# Patient Record
Sex: Female | Born: 1990 | Race: Black or African American | Hispanic: No | Marital: Married | Smoking: Never smoker
Health system: Southern US, Community
[De-identification: ages and names within clinical notes are randomized; demographics above are authoritative.]

## PROBLEM LIST (undated history)

## (undated) DIAGNOSIS — N289 Disorder of kidney and ureter, unspecified: Secondary | ICD-10-CM

## (undated) HISTORY — PX: TONSILLECTOMY: SUR1361

## (undated) HISTORY — PX: OTHER SURGICAL HISTORY: SHX169

## (undated) HISTORY — PX: ADENOIDECTOMY: SUR15

---

## 2020-09-30 ENCOUNTER — Emergency Department (HOSPITAL_COMMUNITY): Payer: No Typology Code available for payment source

## 2020-09-30 ENCOUNTER — Other Ambulatory Visit: Payer: Self-pay

## 2020-09-30 ENCOUNTER — Emergency Department (HOSPITAL_COMMUNITY)
Admission: EM | Admit: 2020-09-30 | Discharge: 2020-09-30 | Disposition: A | Discharge status: Home / Self Care | Payer: No Typology Code available for payment source | Attending: Emergency Medicine | Admitting: Emergency Medicine

## 2020-09-30 DIAGNOSIS — R109 Unspecified abdominal pain: Secondary | ICD-10-CM

## 2020-09-30 DIAGNOSIS — R1030 Lower abdominal pain, unspecified: Secondary | ICD-10-CM | POA: Insufficient documentation

## 2020-09-30 DIAGNOSIS — M545 Low back pain, unspecified: Secondary | ICD-10-CM | POA: Diagnosis not present

## 2020-09-30 DIAGNOSIS — R1031 Right lower quadrant pain: Secondary | ICD-10-CM | POA: Insufficient documentation

## 2020-09-30 DIAGNOSIS — R110 Nausea: Secondary | ICD-10-CM | POA: Diagnosis not present

## 2020-09-30 LAB — CBC WITH DIFFERENTIAL/PLATELET
Abs Immature Granulocytes: 0.05 10*3/uL (ref 0.00–0.07)
Basophils Absolute: 0.1 10*3/uL (ref 0.0–0.1)
Basophils Relative: 1 %
Eosinophils Absolute: 0.1 10*3/uL (ref 0.0–0.5)
Eosinophils Relative: 1 %
HCT: 39.8 % (ref 36.0–46.0)
Hemoglobin: 12.8 g/dL (ref 12.0–15.0)
Immature Granulocytes: 0 %
Lymphocytes Relative: 30 %
Lymphs Abs: 3.7 10*3/uL (ref 0.7–4.0)
MCH: 29.2 pg (ref 26.0–34.0)
MCHC: 32.2 g/dL (ref 30.0–36.0)
MCV: 90.7 fL (ref 80.0–100.0)
Monocytes Absolute: 0.8 10*3/uL (ref 0.1–1.0)
Monocytes Relative: 7 %
Neutro Abs: 7.4 10*3/uL (ref 1.7–7.7)
Neutrophils Relative %: 61 %
Platelets: 353 10*3/uL (ref 150–400)
RBC: 4.39 MIL/uL (ref 3.87–5.11)
RDW: 12.3 % (ref 11.5–15.5)
WBC: 12.2 10*3/uL — ABNORMAL HIGH (ref 4.0–10.5)
nRBC: 0 % (ref 0.0–0.2)

## 2020-09-30 LAB — URINALYSIS, ROUTINE W REFLEX MICROSCOPIC
Bilirubin Urine: NEGATIVE
Glucose, UA: NEGATIVE mg/dL
Hgb urine dipstick: NEGATIVE
Ketones, ur: 15 mg/dL — AB
Leukocytes,Ua: NEGATIVE
Nitrite: NEGATIVE
Protein, ur: NEGATIVE mg/dL
Specific Gravity, Urine: 1.01 (ref 1.005–1.030)
pH: 7 (ref 5.0–8.0)

## 2020-09-30 LAB — COMPREHENSIVE METABOLIC PANEL
ALT: 12 U/L (ref 0–44)
AST: 14 U/L — ABNORMAL LOW (ref 15–41)
Albumin: 4.7 g/dL (ref 3.5–5.0)
Alkaline Phosphatase: 66 U/L (ref 38–126)
Anion gap: 8 (ref 5–15)
BUN: 16 mg/dL (ref 6–20)
CO2: 25 mmol/L (ref 22–32)
Calcium: 8.9 mg/dL (ref 8.9–10.3)
Chloride: 103 mmol/L (ref 98–111)
Creatinine, Ser: 0.8 mg/dL (ref 0.44–1.00)
GFR, Estimated: >60 mL/min (ref >60)
Glucose, Bld: 84 mg/dL (ref 70–99)
Potassium: 4 mmol/L (ref 3.5–5.1)
Sodium: 136 mmol/L (ref 135–145)
Total Bilirubin: 1.1 mg/dL (ref 0.3–1.2)
Total Protein: 8.4 g/dL — ABNORMAL HIGH (ref 6.5–8.1)

## 2020-09-30 LAB — LIPASE, BLOOD: Lipase: 25 U/L (ref 11–51)

## 2020-09-30 LAB — I-STAT BETA HCG BLOOD, ED (MC, WL, AP ONLY): I-stat hCG, quantitative: <5 m[IU]/mL (ref <5)

## 2020-09-30 MED ORDER — NAPROXEN 500 MG PO TABS: 500.0000 mg | ORAL_TABLET | Freq: Two times a day (BID) | ORAL | 0 refills | Status: AC

## 2020-09-30 MED ORDER — IOHEXOL 300 MG/ML  SOLN
100.0000 mL | Freq: Once | INTRAMUSCULAR | Status: AC | PRN
  Administered 2020-09-30: 100 mL via INTRAVENOUS

## 2020-09-30 MED ORDER — MORPHINE SULFATE (PF) 4 MG/ML IV SOLN
4.0000 mg | Freq: Once | INTRAVENOUS | Status: AC
  Administered 2020-09-30: 4 mg via INTRAVENOUS
  Filled 2020-09-30: qty 1

## 2020-09-30 MED ORDER — SODIUM CHLORIDE (PF) 0.9 % IJ SOLN
INTRAMUSCULAR | Status: AC
  Filled 2020-09-30: qty 50

## 2020-09-30 NOTE — Discharge Instructions (Addendum)
Your work-up in the ER today was overall reassuring.  There were no signs of infection, kidney stones, or concerning intra-abdominal emergencies. Take naproxen 2 times a day with meals.  Do not take other anti-inflammatories at the same time (Advil, Motrin, ibuprofen, Aleve). You may supplement with Tylenol if you need further pain control. Use heating pads to help control your pain. I recommend you follow-up with a primary care doctor for symptoms not improving.  If you do not have one, you may call the number on the back of your insurance card to establish with someone in network.  Return to the emergency room if develop high fevers, severe worsening pain, persistent vomiting, any new, worsening, or concerning symptoms

## 2020-09-30 NOTE — ED Provider Notes (Signed)
[facility]-EMERGENCY DEPT Provider Note   CSN: [id number] Arrival date & time: 09/30/20  1000     History Chief Complaint  Patient presents with  . Abdominal Pain    Audrey Gay is a 30 y.o. female presenting for evaluation of abd pain.   Patient states in the past 5 days she has had gradually worsening pain.  It initially was in her right flank and low back.  Yesterday she had an episode of severe sharp pain in her right lower quadrant.  She continues to have some mild lower abdominal discomfort, but mostly her pain is in her back.  She has associated nausea, no vomiting.  She denies fevers, chills, chest pain, shortness of breath, cough, urinary symptoms, abnormal bowel movements.  No change in pain with urination or bowel movements.  No history of similar.  No previous history of kidney stones.  She reports no change in her baseline vaginal discharge.  She is sexually active with 1 female partner, does not use condoms or contraception.  Her last period was almost a month ago, was normal for her.  She took a home pregnancy test yesterday which was negative.  HPI     No past medical history on file.  There are no problems to display for this patient.    OB History   No obstetric history on file.     No family history on file.     Home Medications Prior to Admission medications   Medication Sig Start Date End Date Taking? Authorizing Provider  naproxen (NAPROSYN) 500 MG tablet Take 1 tablet (500 mg total) by mouth 2 (two) times daily with a meal. 09/30/20  Yes Kathlyne Loud, PA-C    Allergies    Amoxicillin  Review of Systems   Review of Systems  Gastrointestinal: Positive for abdominal pain and nausea.  Genitourinary: Positive for flank pain.  All other systems reviewed and are negative.   Physical Exam Updated Vital Signs BP (!) 115/59 (BP Location: Left Arm)   Pulse 64   Temp 98.4 F (36.9 C) (Oral)   Resp 16   Ht 5\' 7"  (1.702 m)    LMP 09/08/2020 Comment: negative beta HCG 09/30/20  SpO2 100%   Physical Exam Vitals and nursing note reviewed.  Constitutional:      General: She is not in acute distress.    Appearance: She is well-developed.     Comments: appears uncomfortable, but otherwise nontoxic  HENT:     Head: Normocephalic and atraumatic.  Eyes:     Conjunctiva/sclera: Conjunctivae normal.     Pupils: Pupils are equal, round, and reactive to light.  Cardiovascular:     Rate and Rhythm: Normal rate and regular rhythm.     Pulses: Normal pulses.  Pulmonary:     Effort: Pulmonary effort is normal. No respiratory distress.     Breath sounds: Normal breath sounds. No wheezing.  Abdominal:     General: There is no distension.     Palpations: Abdomen is soft. There is no mass.     Tenderness: There is abdominal tenderness. There is right CVA tenderness. There is no guarding or rebound.     Comments: ttp of R flank and RLQ abd. No rigidity or distention.   Musculoskeletal:        General: Normal range of motion.     Cervical back: Normal range of motion and neck supple.  Skin:    General: Skin is warm and dry.  Capillary Refill: Capillary refill takes less than 2 seconds.  Neurological:     Mental Status: She is alert and oriented to person, place, and time.     ED Results / Procedures / Treatments   Labs (all labs ordered are listed, but only abnormal results are displayed) Labs Reviewed  CBC WITH DIFFERENTIAL/PLATELET - Abnormal; Notable for the following components:      Result Value   WBC 12.2 (*)    All other components within normal limits  COMPREHENSIVE METABOLIC PANEL - Abnormal; Notable for the following components:   Total Protein 8.4 (*)    AST 14 (*)    All other components within normal limits  URINALYSIS, ROUTINE W REFLEX MICROSCOPIC - Abnormal; Notable for the following components:   Ketones, ur 15 (*)    All other components within normal limits  LIPASE, BLOOD  I-STAT BETA HCG  BLOOD, ED ([facility], [facility], [facility] ONLY)    EKG None  Radiology CT ABDOMEN PELVIS W CONTRAST  Result Date: 09/30/2020 CLINICAL DATA:  Right lower quadrant abdominal pain. EXAM: CT ABDOMEN AND PELVIS WITH CONTRAST TECHNIQUE: Multidetector CT imaging of the abdomen and pelvis was performed using the standard protocol following bolus administration of intravenous contrast. CONTRAST:  OMNIPAQUE IOHEXOL 300 MG/ML  SOLN COMPARISON:  None. FINDINGS: Lower chest: No acute abnormality. Hepatobiliary: No focal liver abnormality is seen. No gallstones, gallbladder wall thickening, or biliary dilatation. Pancreas: Unremarkable. No pancreatic ductal dilatation or surrounding inflammatory changes. Spleen: Normal in size without focal abnormality. Adrenals/Urinary Tract: Adrenal glands are unremarkable. A nonobstructive right renal calculus measures 2 mm. Otherwise, the kidneys are normal, without renal calculi, focal lesion, or hydronephrosis. Bladder is unremarkable. Stomach/Bowel: Stomach is within normal limits. Appendix appears normal. No evidence of bowel wall thickening, distention, or inflammatory changes. Vascular/Lymphatic: No significant vascular findings are present. No enlarged abdominal or pelvic lymph nodes. Reproductive: Uterus and bilateral adnexa are unremarkable. Other: No abdominal wall hernia or abnormality. No abdominopelvic ascites. Musculoskeletal: No acute or significant osseous findings. IMPRESSION: No findings to explain the patient's symptoms. Electronically Signed   By: Romona Curls M.D.   On: 09/30/2020 17:38    Procedures Procedures   Medications Ordered in ED Medications  morphine 4 MG/ML injection 4 mg (4 mg Intravenous Given 09/30/20 1611)  sodium chloride (PF) 0.9 % injection (  Given 09/30/20 1739)  iohexol (OMNIPAQUE) 300 MG/ML solution 100 mL (100 mLs Intravenous Contrast Given 09/30/20 1709)    ED Course  I have reviewed the triage vital signs and the nursing notes.  Pertinent  labs & imaging results that were available during my care of the patient were reviewed by me and considered in my medical decision making (see chart for details).    MDM Rules/Calculators/A&P                          Pt presenting for evaluation of r sided flank and lower abd pain. On exam, pt appears nontoxic, but uncomfortable. She has ttp of R flank and RLQ. Consider early pregnancy, torsion, appy, pyelo, kidney stone. will order labs, ua, and CT if hcg is neg.   hCG is negative.  Mild leukocytosis of 12.  In the setting of focal right lower quadrant pain, will obtain CT abdomen pelvis with contrast to rule out appendicitis.  CT abdomen pelvis negative for acute findings.  On reassessment, patient reports significant improvement of symptoms.  Urine is negative for infection.  Discussed  that at this time, there is not a clear cause for her symptoms however there is not appear to be an acute or life-threatening condition requiring surgery or hospitalization.  Discussed continued symptomatic treatment, follow-up as needed.  At this time, patient appears safe for discharge.  Return precautions given.  Patient states she understands and agrees to plan.  Final Clinical Impression(s) / ED Diagnoses Final diagnoses:  Right sided abdominal pain  Acute right-sided low back pain without sciatica    Rx / DC Orders ED Discharge Orders         Ordered    naproxen (NAPROSYN) 500 MG tablet  2 times daily with meals        09/30/20 2033           Alveria Apley, PA-C 09/30/20 2227    Koleen Distance, MD 09/30/20 2318

## 2020-09-30 NOTE — ED Notes (Signed)
Appears to be resting.  No distress noted

## 2020-09-30 NOTE — ED Triage Notes (Signed)
Patient reports having lower back pain and now it is radiating to abdomen. Pain is 10/10. Denies injury

## 2020-10-16 ENCOUNTER — Encounter (HOSPITAL_COMMUNITY): Payer: Self-pay

## 2020-10-16 ENCOUNTER — Emergency Department (HOSPITAL_COMMUNITY)
Admission: EM | Admit: 2020-10-16 | Discharge: 2020-10-16 | Disposition: A | Discharge status: Home / Self Care | Payer: No Typology Code available for payment source | Attending: Emergency Medicine | Admitting: Emergency Medicine

## 2020-10-16 ENCOUNTER — Other Ambulatory Visit: Payer: Self-pay

## 2020-10-16 DIAGNOSIS — R300 Dysuria: Secondary | ICD-10-CM | POA: Diagnosis present

## 2020-10-16 DIAGNOSIS — N3001 Acute cystitis with hematuria: Secondary | ICD-10-CM | POA: Insufficient documentation

## 2020-10-16 DIAGNOSIS — R519 Headache, unspecified: Secondary | ICD-10-CM | POA: Insufficient documentation

## 2020-10-16 HISTORY — DX: Disorder of kidney and ureter, unspecified: N28.9

## 2020-10-16 LAB — CBC WITH DIFFERENTIAL/PLATELET
Abs Immature Granulocytes: 0.03 10*3/uL (ref 0.00–0.07)
Basophils Absolute: 0.1 10*3/uL (ref 0.0–0.1)
Basophils Relative: 1 %
Eosinophils Absolute: 0.2 10*3/uL (ref 0.0–0.5)
Eosinophils Relative: 2 %
HCT: 36.4 % (ref 36.0–46.0)
Hemoglobin: 11.7 g/dL — ABNORMAL LOW (ref 12.0–15.0)
Immature Granulocytes: 0 %
Lymphocytes Relative: 26 %
Lymphs Abs: 2.2 10*3/uL (ref 0.7–4.0)
MCH: 29.5 pg (ref 26.0–34.0)
MCHC: 32.1 g/dL (ref 30.0–36.0)
MCV: 91.9 fL (ref 80.0–100.0)
Monocytes Absolute: 0.6 10*3/uL (ref 0.1–1.0)
Monocytes Relative: 7 %
Neutro Abs: 5.4 10*3/uL (ref 1.7–7.7)
Neutrophils Relative %: 64 %
Platelets: 330 10*3/uL (ref 150–400)
RBC: 3.96 MIL/uL (ref 3.87–5.11)
RDW: 12.2 % (ref 11.5–15.5)
WBC: 8.5 10*3/uL (ref 4.0–10.5)
nRBC: 0 % (ref 0.0–0.2)

## 2020-10-16 LAB — URINALYSIS, ROUTINE W REFLEX MICROSCOPIC
Bilirubin Urine: NEGATIVE
Glucose, UA: NEGATIVE mg/dL
Ketones, ur: NEGATIVE mg/dL
Nitrite: POSITIVE — AB
Protein, ur: 100 mg/dL — AB
Specific Gravity, Urine: 1.018 (ref 1.005–1.030)
WBC, UA: >50 WBC/hpf — ABNORMAL HIGH (ref 0–5)
pH: 6 (ref 5.0–8.0)

## 2020-10-16 LAB — BASIC METABOLIC PANEL
Anion gap: 7 (ref 5–15)
BUN: 17 mg/dL (ref 6–20)
CO2: 26 mmol/L (ref 22–32)
Calcium: 9.2 mg/dL (ref 8.9–10.3)
Chloride: 105 mmol/L (ref 98–111)
Creatinine, Ser: 0.8 mg/dL (ref 0.44–1.00)
GFR, Estimated: >60 mL/min (ref >60)
Glucose, Bld: 86 mg/dL (ref 70–99)
Potassium: 4.5 mmol/L (ref 3.5–5.1)
Sodium: 138 mmol/L (ref 135–145)

## 2020-10-16 LAB — PREGNANCY, URINE: Preg Test, Ur: NEGATIVE

## 2020-10-16 MED ORDER — SODIUM CHLORIDE 0.9 % IV BOLUS
1000.0000 mL | Freq: Once | INTRAVENOUS | Status: AC
  Administered 2020-10-16: 1000 mL via INTRAVENOUS

## 2020-10-16 MED ORDER — PHENAZOPYRIDINE HCL 200 MG PO TABS: 200.0000 mg | ORAL_TABLET | Freq: Three times a day (TID) | ORAL | 0 refills | Status: AC

## 2020-10-16 MED ORDER — SULFAMETHOXAZOLE-TRIMETHOPRIM 800-160 MG PO TABS
1.0000 | ORAL_TABLET | Freq: Once | ORAL | Status: AC
  Administered 2020-10-16: 1 via ORAL
  Filled 2020-10-16: qty 1

## 2020-10-16 MED ORDER — PHENAZOPYRIDINE HCL 200 MG PO TABS
200.0000 mg | ORAL_TABLET | Freq: Three times a day (TID) | ORAL | Status: DC
  Administered 2020-10-16: 200 mg via ORAL
  Filled 2020-10-16: qty 1

## 2020-10-16 MED ORDER — SULFAMETHOXAZOLE-TRIMETHOPRIM 800-160 MG PO TABS: 1.0000 | ORAL_TABLET | Freq: Two times a day (BID) | ORAL | 0 refills | Status: AC

## 2020-10-16 MED ORDER — KETOROLAC TROMETHAMINE 30 MG/ML IJ SOLN
30.0000 mg | Freq: Once | INTRAMUSCULAR | Status: AC
  Administered 2020-10-16: 30 mg via INTRAVENOUS
  Filled 2020-10-16: qty 1

## 2020-10-16 NOTE — ED Triage Notes (Signed)
Patient c/o right lower back pain, hematuria, nausea, and dysuria since last week. Patient states she was diagnosed with a kidney stone 15 days ago.  Patient c/o migraine that started this AM.

## 2020-10-16 NOTE — ED Provider Notes (Signed)
[facility]-EMERGENCY DEPT Provider Note   CSN: [id number] Arrival date & time: 10/16/20  1007     History Chief Complaint  Patient presents with   Hematuria   Dysuria   Back Pain   Migraine    Audrey Gay is a 30 y.o. female.  Pt presents to the ED today with right sided flank pain, hematuria, dysuria, nausea, and headache.  Pt was here on 6/4 with abdominal pain.  She had a CT scan then which showed a stone in her right kidney, but no other abn.  Pt said she thought she was passing the stone, but sx have worsened, so she came in.        Past Medical History:  Diagnosis Date   Renal disorder     There are no problems to display for this patient.   Past Surgical History:  Procedure Laterality Date   ADENOIDECTOMY     endoscopic gastric sleeve     TONSILLECTOMY       OB History   No obstetric history on file.     Family History  Problem Relation Age of Onset   Osteoarthritis Mother    Lupus Mother    Kidney Stones Mother     Social History   Tobacco Use   Smoking status: Never   Smokeless tobacco: Never  Vaping Use   Vaping Use: Never used  Substance Use Topics   Alcohol use: Yes   Drug use: Never    Home Medications Prior to Admission medications   Medication Sig Start Date End Date Taking? Authorizing Provider  phenazopyridine (PYRIDIUM) 200 MG tablet Take 1 tablet (200 mg total) by mouth 3 (three) times daily. 10/16/20  Yes Jacalyn Lefevre, MD  sulfamethoxazole-trimethoprim (BACTRIM DS) 800-160 MG tablet Take 1 tablet by mouth 2 (two) times daily for 7 days. 10/16/20 10/23/20 Yes Jacalyn Lefevre, MD  naproxen (NAPROSYN) 500 MG tablet Take 1 tablet (500 mg total) by mouth 2 (two) times daily with a meal. 09/30/20   Caccavale, Sophia, PA-C    Allergies    Amoxicillin  Review of Systems   Review of Systems  Genitourinary:  Positive for dysuria and hematuria.  Neurological:  Positive for headaches.  All other systems  reviewed and are negative.  Physical Exam Updated Vital Signs BP 114/73   Pulse (!) 56   Temp 98.2 F (36.8 C) (Oral)   Resp 16   Ht 5\' 7"  (1.702 m)   Wt 89.8 kg   LMP 09/13/2020 (Approximate)   SpO2 100%   BMI 31.01 kg/m   Physical Exam Vitals and nursing note reviewed.  Constitutional:      Appearance: Normal appearance.  HENT:     Head: Normocephalic and atraumatic.     Right Ear: External ear normal.     Left Ear: External ear normal.     Nose: Nose normal.     Mouth/Throat:     Mouth: Mucous membranes are moist.     Pharynx: Oropharynx is clear.  Eyes:     Extraocular Movements: Extraocular movements intact.     Conjunctiva/sclera: Conjunctivae normal.     Pupils: Pupils are equal, round, and reactive to light.  Cardiovascular:     Rate and Rhythm: Normal rate and regular rhythm.     Pulses: Normal pulses.     Heart sounds: Normal heart sounds.  Pulmonary:     Effort: Pulmonary effort is normal.     Breath sounds: Normal breath sounds.  Abdominal:  General: Abdomen is flat. Bowel sounds are normal.     Palpations: Abdomen is soft.  Musculoskeletal:        General: Normal range of motion.     Cervical back: Normal range of motion and neck supple.  Skin:    General: Skin is warm.     Capillary Refill: Capillary refill takes less than 2 seconds.  Neurological:     General: No focal deficit present.     Mental Status: She is alert and oriented to person, place, and time.  Psychiatric:        Mood and Affect: Mood normal.        Behavior: Behavior normal.    ED Results / Procedures / Treatments   Labs (all labs ordered are listed, but only abnormal results are displayed) Labs Reviewed  URINALYSIS, ROUTINE W REFLEX MICROSCOPIC - Abnormal; Notable for the following components:      Result Value   APPearance CLOUDY (*)    Hgb urine dipstick MODERATE (*)    Protein, ur 100 (*)    Nitrite POSITIVE (*)    Leukocytes,Ua LARGE (*)    WBC, UA >50 (*)     Bacteria, UA FEW (*)    All other components within normal limits  CBC WITH DIFFERENTIAL/PLATELET - Abnormal; Notable for the following components:   Hemoglobin 11.7 (*)    All other components within normal limits  BASIC METABOLIC PANEL  PREGNANCY, URINE    EKG None  Radiology No results found.  Procedures Procedures   Medications Ordered in ED Medications  sulfamethoxazole-trimethoprim (BACTRIM DS) 800-160 MG per tablet 1 tablet (has no administration in time range)  phenazopyridine (PYRIDIUM) tablet 200 mg (has no administration in time range)  sodium chloride 0.9 % bolus 1,000 mL (1,000 mLs Intravenous New Bag/Given 10/16/20 1125)  ketorolac (TORADOL) 30 MG/ML injection 30 mg (30 mg Intravenous Given 10/16/20 1125)    ED Course  I have reviewed the triage vital signs and the nursing notes.  Pertinent labs & imaging results that were available during my care of the patient were reviewed by me and considered in my medical decision making (see chart for details).    MDM Rules/Calculators/A&P                         Pt is feeling much better.  Complaints more c/w uti as opposed to kidney stone.  Pt will be started on bactrim.  Return if worse.  F/u with pcp. Final Clinical Impression(s) / ED Diagnoses Final diagnoses:  Acute cystitis with hematuria    Rx / DC Orders ED Discharge Orders          Ordered    sulfamethoxazole-trimethoprim (BACTRIM DS) 800-160 MG tablet  2 times daily        10/16/20 1255    phenazopyridine (PYRIDIUM) 200 MG tablet  3 times daily        10/16/20 1255             Jacalyn Lefevre, MD 10/16/20 1256

## 2020-11-08 ENCOUNTER — Ambulatory Visit: Payer: Self-pay | Admitting: Family Medicine

## 2020-12-01 ENCOUNTER — Emergency Department (HOSPITAL_COMMUNITY)
Admission: EM | Admit: 2020-12-01 | Discharge: 2020-12-02 | Disposition: A | Discharge status: Home / Self Care | Payer: No Typology Code available for payment source | Attending: Emergency Medicine | Admitting: Emergency Medicine

## 2020-12-01 ENCOUNTER — Encounter (HOSPITAL_COMMUNITY): Payer: Self-pay | Admitting: Emergency Medicine

## 2020-12-01 ENCOUNTER — Other Ambulatory Visit: Payer: Self-pay

## 2020-12-01 DIAGNOSIS — R519 Headache, unspecified: Secondary | ICD-10-CM | POA: Insufficient documentation

## 2020-12-01 DIAGNOSIS — R509 Fever, unspecified: Secondary | ICD-10-CM | POA: Diagnosis present

## 2020-12-01 DIAGNOSIS — N3000 Acute cystitis without hematuria: Secondary | ICD-10-CM | POA: Diagnosis not present

## 2020-12-01 DIAGNOSIS — Z20822 Contact with and (suspected) exposure to covid-19: Secondary | ICD-10-CM | POA: Insufficient documentation

## 2020-12-01 DIAGNOSIS — G43909 Migraine, unspecified, not intractable, without status migrainosus: Secondary | ICD-10-CM | POA: Insufficient documentation

## 2020-12-01 LAB — CBC WITH DIFFERENTIAL/PLATELET
Abs Immature Granulocytes: 0.04 10*3/uL (ref 0.00–0.07)
Basophils Absolute: 0 10*3/uL (ref 0.0–0.1)
Basophils Relative: 0 %
Eosinophils Absolute: 0 10*3/uL (ref 0.0–0.5)
Eosinophils Relative: 0 %
HCT: 38.5 % (ref 36.0–46.0)
Hemoglobin: 12.3 g/dL (ref 12.0–15.0)
Immature Granulocytes: 0 %
Lymphocytes Relative: 14 %
Lymphs Abs: 1.6 10*3/uL (ref 0.7–4.0)
MCH: 29.1 pg (ref 26.0–34.0)
MCHC: 31.9 g/dL (ref 30.0–36.0)
MCV: 91 fL (ref 80.0–100.0)
Monocytes Absolute: 1.3 10*3/uL — ABNORMAL HIGH (ref 0.1–1.0)
Monocytes Relative: 12 %
Neutro Abs: 7.8 10*3/uL — ABNORMAL HIGH (ref 1.7–7.7)
Neutrophils Relative %: 74 %
Platelets: 300 10*3/uL (ref 150–400)
RBC: 4.23 MIL/uL (ref 3.87–5.11)
RDW: 11.8 % (ref 11.5–15.5)
WBC: 10.7 10*3/uL — ABNORMAL HIGH (ref 4.0–10.5)
nRBC: 0 % (ref 0.0–0.2)

## 2020-12-01 LAB — URINALYSIS, ROUTINE W REFLEX MICROSCOPIC
Bilirubin Urine: NEGATIVE
Glucose, UA: NEGATIVE mg/dL
Ketones, ur: 5 mg/dL — AB
Nitrite: POSITIVE — AB
Protein, ur: NEGATIVE mg/dL
Specific Gravity, Urine: 1.018 (ref 1.005–1.030)
pH: 5 (ref 5.0–8.0)

## 2020-12-01 LAB — COMPREHENSIVE METABOLIC PANEL
ALT: 13 U/L (ref 0–44)
AST: 14 U/L — ABNORMAL LOW (ref 15–41)
Albumin: 4.2 g/dL (ref 3.5–5.0)
Alkaline Phosphatase: 69 U/L (ref 38–126)
Anion gap: 7 (ref 5–15)
BUN: 12 mg/dL (ref 6–20)
CO2: 25 mmol/L (ref 22–32)
Calcium: 9.4 mg/dL (ref 8.9–10.3)
Chloride: 104 mmol/L (ref 98–111)
Creatinine, Ser: 0.89 mg/dL (ref 0.44–1.00)
GFR, Estimated: >60 mL/min (ref >60)
Glucose, Bld: 91 mg/dL (ref 70–99)
Potassium: 3.9 mmol/L (ref 3.5–5.1)
Sodium: 136 mmol/L (ref 135–145)
Total Bilirubin: 1.3 mg/dL — ABNORMAL HIGH (ref 0.3–1.2)
Total Protein: 8.1 g/dL (ref 6.5–8.1)

## 2020-12-01 LAB — POC URINE PREG, ED: Preg Test, Ur: NEGATIVE

## 2020-12-01 LAB — RESP PANEL BY RT-PCR (FLU A&B, COVID) ARPGX2
Influenza A by PCR: NEGATIVE
Influenza B by PCR: NEGATIVE
SARS Coronavirus 2 by RT PCR: NEGATIVE

## 2020-12-01 LAB — PREGNANCY, URINE: Preg Test, Ur: NEGATIVE

## 2020-12-01 MED ORDER — ACETAMINOPHEN 500 MG PO TABS
1000.0000 mg | ORAL_TABLET | Freq: Once | ORAL | Status: AC
  Administered 2020-12-01: 1000 mg via ORAL
  Filled 2020-12-01: qty 2

## 2020-12-01 NOTE — ED Triage Notes (Signed)
Fever, headache, back pain since yesterday. Mild nausea. Febrile in triage. Pt is vaccinated.

## 2020-12-01 NOTE — ED Provider Notes (Signed)
Emergency Medicine Provider Triage Evaluation Note  Savita Runner , a 30 y.o. female  was evaluated in triage.  Pt complains of fever, myalgias throuhgout body, nausea since yesterday. Has HA, took Excedrin this morning with some relief. No cough, sore throat. Has been vaccinated against COVID. No tick bites, No rashes.   Review of Systems  Positive: Fever, myalgias, headache, nausea  Negative: Cough, sob,   Physical Exam  BP 138/71 (BP Location: Left Arm)   Pulse 97   Temp (!) 102.8 F (39.3 C) (Oral)   Resp 15   SpO2 100%  Gen:   Awake, no distress   Resp:  Normal effort  MSK:   Moves extremities without difficulty  Other:  No meningismus   Medical Decision Making  Medically screening exam initiated at 8:00 PM.  Appropriate orders placed.  Ajanay Farve was informed that the remainder of the evaluation will be completed by another provider, this initial triage assessment does not replace that evaluation, and the importance of remaining in the ED until their evaluation is complete.    Farrel Gordon, PA-C 12/01/20 2002    Alvira Monday, MD 12/03/20 [phone]

## 2020-12-02 MED ORDER — SULFAMETHOXAZOLE-TRIMETHOPRIM 800-160 MG PO TABS
1.0000 | ORAL_TABLET | Freq: Once | ORAL | Status: AC
  Administered 2020-12-02: 1 via ORAL
  Filled 2020-12-02: qty 1

## 2020-12-02 MED ORDER — SULFAMETHOXAZOLE-TRIMETHOPRIM 800-160 MG PO TABS: 1.0000 | ORAL_TABLET | Freq: Two times a day (BID) | ORAL | 0 refills | Status: AC

## 2020-12-02 MED ORDER — KETOROLAC TROMETHAMINE 30 MG/ML IJ SOLN
30.0000 mg | Freq: Once | INTRAMUSCULAR | Status: AC
Start: 2020-12-02 — End: 2020-12-02
  Administered 2020-12-02: 30 mg via INTRAMUSCULAR
  Filled 2020-12-02: qty 1

## 2020-12-02 NOTE — ED Provider Notes (Signed)
[facility]-EMERGENCY DEPT Provider Note   CSN: [id number] Arrival date & time: 12/01/20  1848     History Chief Complaint  Patient presents with   Fever   Back Pain   Migraine    Audrey Gay is a 30 y.o. female.  Pt presents to the ED today with fever, back pain, and headache.  She was febrile in triage.  She was given tylenol in triage for her fever and is feeling better.        Past Medical History:  Diagnosis Date   Renal disorder     There are no problems to display for this patient.   Past Surgical History:  Procedure Laterality Date   ADENOIDECTOMY     endoscopic gastric sleeve     TONSILLECTOMY       OB History   No obstetric history on file.     Family History  Problem Relation Age of Onset   Osteoarthritis Mother    Lupus Mother    Kidney Stones Mother     Social History   Tobacco Use   Smoking status: Never   Smokeless tobacco: Never  Vaping Use   Vaping Use: Never used  Substance Use Topics   Alcohol use: Yes   Drug use: Never    Home Medications Prior to Admission medications   Medication Sig Start Date End Date Taking? Authorizing Provider  naproxen (NAPROSYN) 500 MG tablet Take 1 tablet (500 mg total) by mouth 2 (two) times daily with a meal. 09/30/20   Caccavale, Sophia, PA-C  phenazopyridine (PYRIDIUM) 200 MG tablet Take 1 tablet (200 mg total) by mouth 3 (three) times daily. 10/16/20   Jacalyn Lefevre, MD    Allergies    Amoxicillin  Review of Systems   Review of Systems  Constitutional:  Positive for chills and fever.  Respiratory:  Positive for cough.   Musculoskeletal:  Positive for back pain.  All other systems reviewed and are negative.  Physical Exam Updated Vital Signs BP 112/66 (BP Location: Left Arm)   Pulse 65   Temp 98.8 F (37.1 C) (Oral)   Resp 20   Ht 5\' 7"  (1.702 m)   Wt 88.5 kg   SpO2 97%   BMI 30.54 kg/m   Physical Exam Vitals and nursing note reviewed.  Constitutional:       Appearance: Normal appearance.  HENT:     Head: Normocephalic and atraumatic.     Right Ear: External ear normal.     Left Ear: External ear normal.     Nose: Nose normal.     Mouth/Throat:     Mouth: Mucous membranes are moist.  Eyes:     Extraocular Movements: Extraocular movements intact.     Conjunctiva/sclera: Conjunctivae normal.     Pupils: Pupils are equal, round, and reactive to light.  Cardiovascular:     Rate and Rhythm: Normal rate and regular rhythm.     Pulses: Normal pulses.     Heart sounds: Normal heart sounds.  Pulmonary:     Effort: Pulmonary effort is normal.     Breath sounds: Normal breath sounds.  Abdominal:     General: Abdomen is flat. Bowel sounds are normal.     Palpations: Abdomen is soft.  Musculoskeletal:        General: Normal range of motion.     Cervical back: Normal range of motion and neck supple.  Skin:    General: Skin is warm.  Capillary Refill: Capillary refill takes less than 2 seconds.  Neurological:     General: No focal deficit present.     Mental Status: She is alert and oriented to person, place, and time.  Psychiatric:        Mood and Affect: Mood normal.        Behavior: Behavior normal.        Thought Content: Thought content normal.        Judgment: Judgment normal.    ED Results / Procedures / Treatments   Labs (all labs ordered are listed, but only abnormal results are displayed) Labs Reviewed  COMPREHENSIVE METABOLIC PANEL - Abnormal; Notable for the following components:      Result Value   AST 14 (*)    Total Bilirubin 1.3 (*)    All other components within normal limits  URINALYSIS, ROUTINE W REFLEX MICROSCOPIC - Abnormal; Notable for the following components:   APPearance HAZY (*)    Hgb urine dipstick MODERATE (*)    Ketones, ur 5 (*)    Nitrite POSITIVE (*)    Leukocytes,Ua MODERATE (*)    Bacteria, UA MANY (*)    All other components within normal limits  CBC WITH DIFFERENTIAL/PLATELET -  Abnormal; Notable for the following components:   WBC 10.7 (*)    Neutro Abs 7.8 (*)    Monocytes Absolute 1.3 (*)    All other components within normal limits  RESP PANEL BY RT-PCR (FLU A&B, COVID) ARPGX2  PREGNANCY, URINE  POC URINE PREG, ED    EKG None  Radiology No results found.  Procedures Procedures   Medications Ordered in ED Medications  ketorolac (TORADOL) 30 MG/ML injection 30 mg (has no administration in time range)  sulfamethoxazole-trimethoprim (BACTRIM DS) 800-160 MG per tablet 1 tablet (has no administration in time range)  acetaminophen (TYLENOL) tablet 1,000 mg (1,000 mg Oral Given 12/01/20 2013)    ED Course  I have reviewed the triage vital signs and the nursing notes.  Pertinent labs & imaging results that were available during my care of the patient were reviewed by me and considered in my medical decision making (see chart for details).    MDM Rules/Calculators/A&P                           Fever is better with tylenol.  Covid neg.  UA with UTI.  Pt given toradol and bactrim.  She is stable for d/c.  Return if worse.  Audrey Gay was evaluated in Emergency Department on 12/02/2020 for the symptoms described in the history of present illness. She was evaluated in the context of the global COVID-19 pandemic, which necessitated consideration that the patient might be at risk for infection with the SARS-CoV-2 virus that causes COVID-19. Institutional protocols and algorithms that pertain to the evaluation of patients at risk for COVID-19 are in a state of rapid change based on information released by regulatory bodies including the CDC and federal and state organizations. These policies and algorithms were followed during the patient's care in the ED.  Final Clinical Impression(s) / ED Diagnoses Final diagnoses:  Acute cystitis without hematuria    Rx / DC Orders ED Discharge Orders     None        Jacalyn Lefevre, MD 12/02/20 [phone]

## 2021-02-17 ENCOUNTER — Emergency Department: Payer: No Typology Code available for payment source

## 2021-02-17 ENCOUNTER — Encounter: Payer: Self-pay | Admitting: Emergency Medicine

## 2021-02-17 ENCOUNTER — Other Ambulatory Visit: Payer: Self-pay

## 2021-02-17 DIAGNOSIS — N9489 Other specified conditions associated with female genital organs and menstrual cycle: Secondary | ICD-10-CM | POA: Insufficient documentation

## 2021-02-17 DIAGNOSIS — Z3A01 Less than 8 weeks gestation of pregnancy: Secondary | ICD-10-CM | POA: Insufficient documentation

## 2021-02-17 DIAGNOSIS — O209 Hemorrhage in early pregnancy, unspecified: Secondary | ICD-10-CM | POA: Insufficient documentation

## 2021-02-17 DIAGNOSIS — R1031 Right lower quadrant pain: Secondary | ICD-10-CM

## 2021-02-17 LAB — CBC
HCT: 35.8 % — ABNORMAL LOW (ref 36.0–46.0)
Hemoglobin: 11.7 g/dL — ABNORMAL LOW (ref 12.0–15.0)
MCH: 29.3 pg (ref 26.0–34.0)
MCHC: 32.7 g/dL (ref 30.0–36.0)
MCV: 89.5 fL (ref 80.0–100.0)
Platelets: 337 10*3/uL (ref 150–400)
RBC: 4 MIL/uL (ref 3.87–5.11)
RDW: 12.3 % (ref 11.5–15.5)
WBC: 12.6 10*3/uL — ABNORMAL HIGH (ref 4.0–10.5)
nRBC: 0 % (ref 0.0–0.2)

## 2021-02-17 LAB — COMPREHENSIVE METABOLIC PANEL
ALT: 11 U/L (ref 0–44)
AST: 13 U/L — ABNORMAL LOW (ref 15–41)
Albumin: 4 g/dL (ref 3.5–5.0)
Alkaline Phosphatase: 63 U/L (ref 38–126)
Anion gap: 8 (ref 5–15)
BUN: 17 mg/dL (ref 6–20)
CO2: 24 mmol/L (ref 22–32)
Calcium: 9 mg/dL (ref 8.9–10.3)
Chloride: 104 mmol/L (ref 98–111)
Creatinine, Ser: 0.94 mg/dL (ref 0.44–1.00)
GFR, Estimated: >60 mL/min (ref >60)
Glucose, Bld: 89 mg/dL (ref 70–99)
Potassium: 3.9 mmol/L (ref 3.5–5.1)
Sodium: 136 mmol/L (ref 135–145)
Total Bilirubin: 0.8 mg/dL (ref 0.3–1.2)
Total Protein: 7.3 g/dL (ref 6.5–8.1)

## 2021-02-17 LAB — URINALYSIS, ROUTINE W REFLEX MICROSCOPIC
Bacteria, UA: NONE SEEN
Bilirubin Urine: NEGATIVE
Glucose, UA: NEGATIVE mg/dL
Ketones, ur: NEGATIVE mg/dL
Leukocytes,Ua: NEGATIVE
Nitrite: NEGATIVE
Protein, ur: NEGATIVE mg/dL
Specific Gravity, Urine: 1.025 (ref 1.005–1.030)
pH: 5 (ref 5.0–8.0)

## 2021-02-17 LAB — LIPASE, BLOOD: Lipase: 33 U/L (ref 11–51)

## 2021-02-17 LAB — HCG, QUANTITATIVE, PREGNANCY: hCG, Beta Chain, Quant, S: 1042 m[IU]/mL — ABNORMAL HIGH (ref <5)

## 2021-02-17 LAB — POC URINE PREG, ED: Preg Test, Ur: POSITIVE — AB

## 2021-02-17 NOTE — ED Triage Notes (Signed)
Pt arrived via POV with reports of abd pain after period, pt states she had a period of 10/10 and states she continues to have some bleeding. Pt using motrin and heating pads  Pt c/o RLQ pain.  Pt also c/o back pain and nausea.  Pt states the blood is dark in color.  Pt states she is not having to change a pad or tampon often, states she is using pads and when she changes a pad it is not completely full.  Pt last had motrin 2 hours PTA with no relief.  Pt does not have GYN. Pt had copper IUD that was removed during the summer.

## 2021-02-17 NOTE — ED Notes (Signed)
Pt updated on plan of care at this time, pt verbalizes understanding, pt is aware of + pregnancy test and need to stay for [place] results.

## 2021-02-18 ENCOUNTER — Emergency Department
Admission: EM | Admit: 2021-02-18 | Discharge: 2021-02-18 | Disposition: A | Discharge status: Home / Self Care | Payer: No Typology Code available for payment source | Attending: Emergency Medicine | Admitting: Emergency Medicine

## 2021-02-18 DIAGNOSIS — R1031 Right lower quadrant pain: Secondary | ICD-10-CM

## 2021-02-18 DIAGNOSIS — O4690 Antepartum hemorrhage, unspecified, unspecified trimester: Secondary | ICD-10-CM

## 2021-02-18 LAB — ABO/RH: ABO/RH(D): O POS

## 2021-02-18 NOTE — Discharge Instructions (Signed)
As we discussed, your pregnancy hormones are very low and the ultrasound does not show yet a pregnancy.  At this time it is unclear if you had a miscarriage, if this is an early pregnancy, or if this is a pregnancy outside of the uterus.  Therefore it is very important that you have a repeat ultrasound and blood work in 7 to 10 days.  You may have that done at [facility] department, [facility] or here in the emergency room.  In the meantime you may start prenatal vitamins.  Avoid any over-the-counter medications for pain other than Tylenol.  Avoid alcohol.  Return to the emergency room for severe abdominal pain or if you feel like you are going to pass out or if you do pass out.

## 2021-02-18 NOTE — ED Provider Notes (Signed)
[facility] Emergency Department Provider Note  ____________________________________________  Time seen: Approximately 1:40 AM  I have reviewed the triage vital signs and the nursing notes.   HISTORY  Chief Complaint Abdominal Pain and Vaginal Bleeding   HPI Audrey Gay is a 30 y.o. female with no significant past medical history who presents for evaluation of vaginal bleeding.  Patient reports that her last menstrual period started about 10 days ago.  Since then she has not fully stop bleeding.  She also has had more severe lower abdominal cramping radiating to her back.  She has had some nausea but no vomiting.  No fever or chills.  She has been taking Motrin at home.  She reports that her periods have been abnormal since she had her IUD removed several months ago.  No chest pain or shortness of breath, no fever or chills, no dysuria or hematuria.   Past Medical History:  Diagnosis Date   Renal disorder     There are no problems to display for this patient.   Past Surgical History:  Procedure Laterality Date   ADENOIDECTOMY     endoscopic gastric sleeve     TONSILLECTOMY      Prior to Admission medications   Medication Sig Start Date End Date Taking? Authorizing Provider  naproxen (NAPROSYN) 500 MG tablet Take 1 tablet (500 mg total) by mouth 2 (two) times daily with a meal. 09/30/20   Caccavale, Sophia, PA-C  phenazopyridine (PYRIDIUM) 200 MG tablet Take 1 tablet (200 mg total) by mouth 3 (three) times daily. 10/16/20   Jacalyn Lefevre, MD    Allergies Penicillins and Amoxicillin  Family History  Problem Relation Age of Onset   Osteoarthritis Mother    Lupus Mother    Kidney Stones Mother     Social History Social History   Tobacco Use   Smoking status: Never   Smokeless tobacco: Never  Vaping Use   Vaping Use: Never used  Substance Use Topics   Alcohol use: Yes   Drug use: Never    Review of Systems  Constitutional:  Negative for fever. Eyes: Negative for visual changes. ENT: Negative for sore throat. Neck: No neck pain  Cardiovascular: Negative for chest pain. Respiratory: Negative for shortness of breath. Gastrointestinal: + lower abdominal pain and nausea. No vomiting or diarrhea. Genitourinary: Negative for dysuria. + vaginal bleeding Musculoskeletal: Negative for back pain. Skin: Negative for rash. Neurological: Negative for headaches, weakness or numbness. Psych: No SI or HI  ____________________________________________   PHYSICAL EXAM:  VITAL SIGNS: ED Triage Vitals  Enc Vitals Group     BP 02/17/21 2047 (!) 126/55     Pulse Rate 02/17/21 2047 81     Resp 02/17/21 2047 16     Temp 02/17/21 2047 98.6 F (37 C)     Temp Source 02/17/21 2047 Oral     SpO2 02/17/21 2047 100 %     Weight 02/17/21 2046 195 lb (88.5 kg)     Height 02/17/21 2046 5\' 7"  (1.702 m)     Head Circumference --      Peak Flow --      Pain Score 02/17/21 2058 7     Pain Loc --      Pain Edu? --      Excl. in GC? --     Constitutional: Alert and oriented. Well appearing and in no apparent distress. HEENT:      Head: Normocephalic and atraumatic.  Eyes: Conjunctivae are normal. Sclera is non-icteric.       Mouth/Throat: Mucous membranes are moist.       Neck: Supple with no signs of meningismus. Cardiovascular: Regular rate and rhythm. No murmurs, gallops, or rubs. 2+ symmetrical distal pulses are present in all extremities. No JVD. Respiratory: Normal respiratory effort. Lungs are clear to auscultation bilaterally.  Gastrointestinal: Soft, non tender, and non distended with positive bowel sounds. No rebound or guarding. Genitourinary: No CVA tenderness. Musculoskeletal:  No edema, cyanosis, or erythema of extremities. Neurologic: Normal speech and language. Face is symmetric. Moving all extremities. No gross focal neurologic deficits are appreciated. Skin: Skin is warm, dry and intact. No rash  noted. Psychiatric: Mood and affect are normal. Speech and behavior are normal.  ____________________________________________   LABS (all labs ordered are listed, but only abnormal results are displayed)  Labs Reviewed  COMPREHENSIVE METABOLIC PANEL - Abnormal; Notable for the following components:      Result Value   AST 13 (*)    All other components within normal limits  CBC - Abnormal; Notable for the following components:   WBC 12.6 (*)    Hemoglobin 11.7 (*)    HCT 35.8 (*)    All other components within normal limits  URINALYSIS, ROUTINE W REFLEX MICROSCOPIC - Abnormal; Notable for the following components:   Color, Urine YELLOW (*)    APPearance CLEAR (*)    Hgb urine dipstick LARGE (*)    All other components within normal limits  HCG, QUANTITATIVE, PREGNANCY - Abnormal; Notable for the following components:   hCG, Beta Chain, Quant, S 1,042 (*)    All other components within normal limits  POC URINE PREG, ED - Abnormal; Notable for the following components:   Preg Test, Ur POSITIVE (*)    All other components within normal limits  LIPASE, BLOOD  ABO/RH   ____________________________________________  EKG  none  ____________________________________________  RADIOLOGY  I have personally reviewed the images performed during this visit and I agree with the Radiologist's read.   Interpretation by Radiologist:  US OB LESS THAN 14 WEEKS WITH OB TRANSVAGINAL  Result Date: 02/17/2021 CLINICAL DATA:  Right lower quadrant abdominal pain. LMP: 02/08/2021 corresponding to an estimated gestational age of [redacted] weeks, 2 days. EXAM: OBSTETRIC <14 WK [place] AND TRANSVAGINAL OB US TECHNIQUE: Both transabdominal and transvaginal ultrasound examinations were performed for complete evaluation of the gestation as well as the maternal uterus, adnexal regions, and pelvic cul-de-sac. Transvaginal technique was performed to assess early pregnancy. COMPARISON:  None. FINDINGS: The uterus is  anteverted and measures 4.5 x 9.0 cm. The endometrium measures approximately 6 mm in thickness. There is a small cystic structure in the fundal endometrium. No fetal pole or yolk sac identified within this structure. This may represent an early gestational sac, or a blighted ovum. Pseudogestation of an ectopic pregnancy is not excluded. If this cystic structure is a true gestational sac, the estimated gestational age based on mean sac diameter of 3 mm is 5 weeks, 0 days. No subchorionic hemorrhage identified. The ovaries are unremarkable. There is a corpus luteum in the left ovary. IMPRESSION: Small cystic structure in the endometrium as above. Correlation with serial HCG levels and follow-up with ultrasound in 7-11 days recommended. Electronically Signed   By: Elgie Collard M.D.   On: 02/17/2021 23:01     ____________________________________________   PROCEDURES  Procedure(s) performed: None Procedures   Critical Care performed:  None ____________________________________________   INITIAL IMPRESSION /  ASSESSMENT AND PLAN / ED COURSE  30 y.o. female with no significant past medical history who presents for evaluation of vaginal bleeding.  Patient presents with 10 days of irregular bleeding and lower abdominal cramping.  She is hemodynamically stable.  Hemoglobin is stable.  hCG is positive at 1042.  Transvaginal ultrasound showing a small cystic structure in the endometrium which may represent an early gestational sac or a blighted ovum.  At this time unable to rule out an ectopic pregnancy.  I did discuss with the patient the need for repeat ultrasound and hCG in 7 to 10 days to determine if this is a developing pregnancy and to exclude ectopic pregnancy.  Recommended starting prenatal vitamins, avoiding NSAIDs and taking Tylenol only for pain.  Recommended close follow-up with OB/GYN.  Recommended return to the emergency room for severe abdominal pain, syncope or near syncope.  Patient's blood  type is O+ and no indication for RhoGAM.  Discussed signs and symptoms of acute blood loss anemia recommended return if these develop.      _____________________________________________ Please note:  Patient was evaluated in Emergency Department today for the symptoms described in the history of present illness. Patient was evaluated in the context of the global COVID-19 pandemic, which necessitated consideration that the patient might be at risk for infection with the SARS-CoV-2 virus that causes COVID-19. Institutional protocols and algorithms that pertain to the evaluation of patients at risk for COVID-19 are in a state of rapid change based on information released by regulatory bodies including the CDC and federal and state organizations. These policies and algorithms were followed during the patient's care in the ED.  Some ED evaluations and interventions may be delayed as a result of limited staffing during the pandemic.   [place] Controlled Substance Database was reviewed by me. ____________________________________________   FINAL CLINICAL IMPRESSION(S) / ED DIAGNOSES   Final diagnoses:  Vaginal bleeding in pregnancy      NEW MEDICATIONS STARTED DURING THIS VISIT:  ED Discharge Orders     None        Note:  This document was prepared using Dragon voice recognition software and may include unintentional dictation errors.    Nita Sickle, MD 02/18/21 [phone]

## 2022-12-04 IMAGING — CT CT ABD-PELV W/ CM
2 of 4 series · 16 of 46 positions shown, 18 images · IV contrast (OMNIPAQUE 300)
Comparison: None.

CLINICAL DATA: Right lower quadrant abdominal pain.

EXAM:
CT ABDOMEN AND PELVIS WITH CONTRAST
TECHNIQUE: Multidetector CT imaging of the abdomen and pelvis was performed
using the standard protocol following bolus administration of
intravenous contrast.
CONTRAST:  100mL OMNIPAQUE IOHEXOL 300 MG/ML  SOLN

[Series 2: axial st · axial · 0.66mm/px · z∈[+1118,+1503]mm · 13 of 87 slices shown, 15 images]
[im 5/87  soft-tissue]
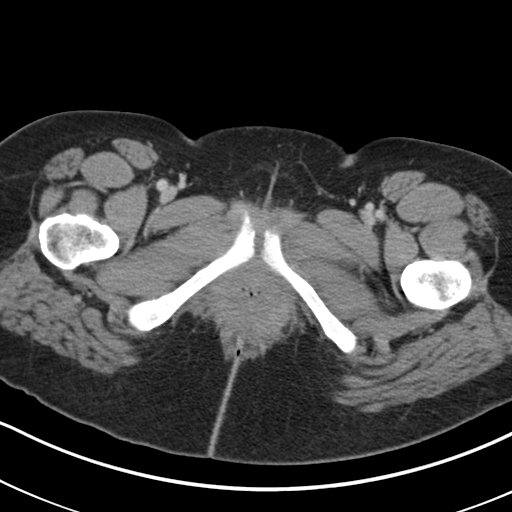
[im 5/87  bone]
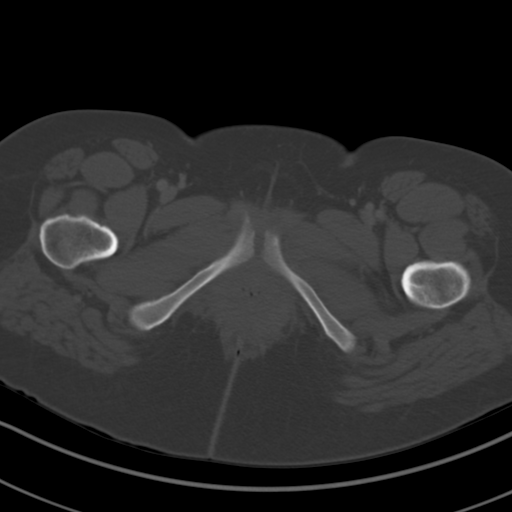
[im 13/87  soft-tissue]
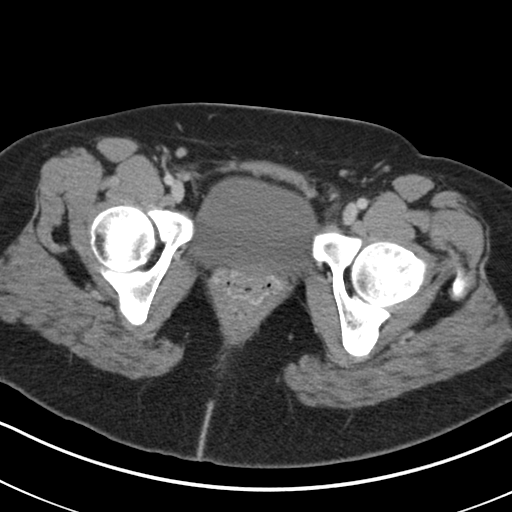
[im 17/87  soft-tissue]
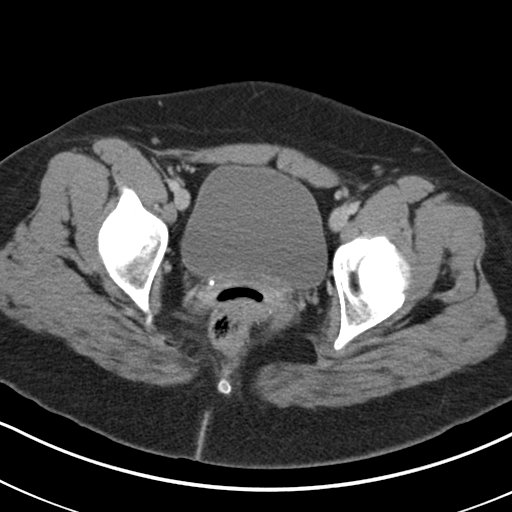
[im 25/87  soft-tissue]
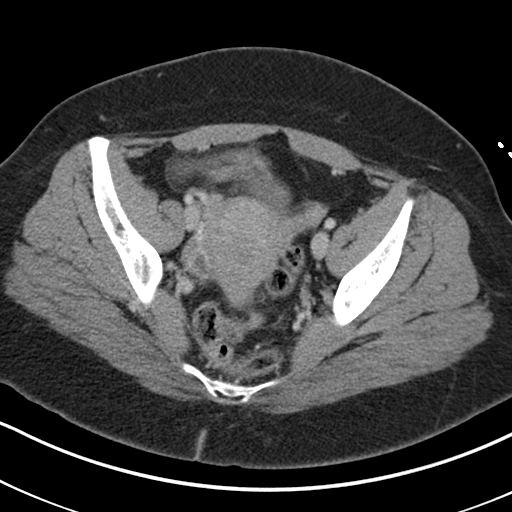
[im 29/87  soft-tissue]
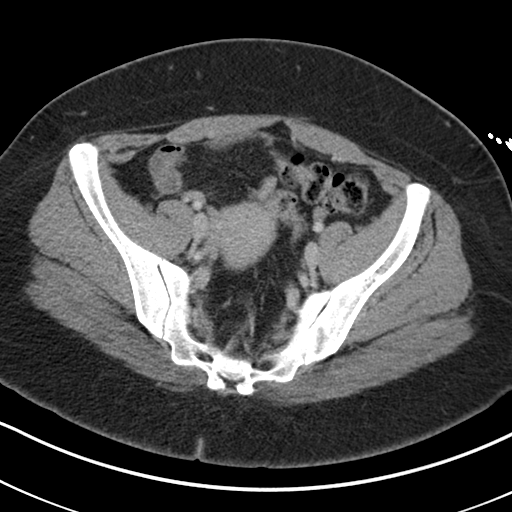
[im 37/87  soft-tissue]
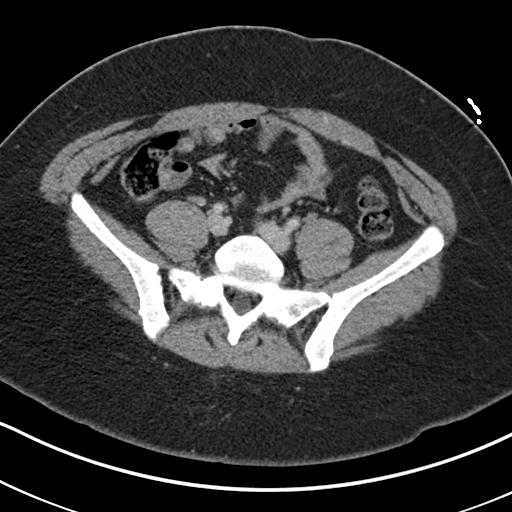
[im 46/87  soft-tissue]
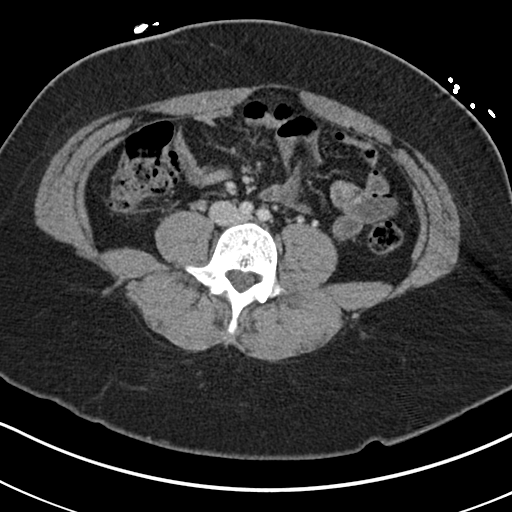
[im 50/87  soft-tissue]
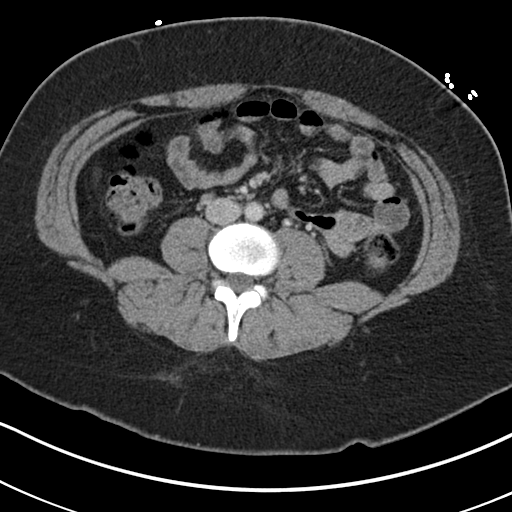
[im 58/87  soft-tissue]
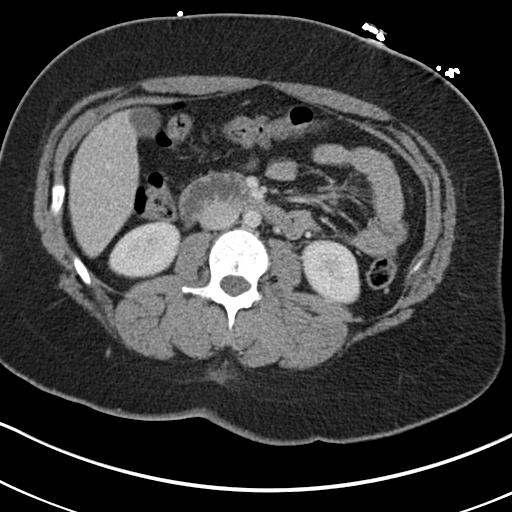
[im 58/87  bone]
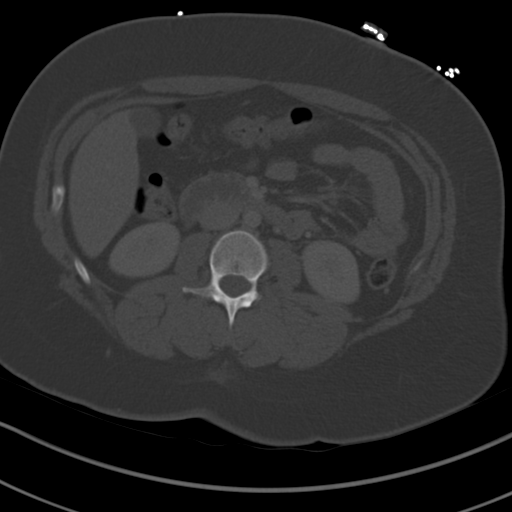
[im 62/87  soft-tissue]
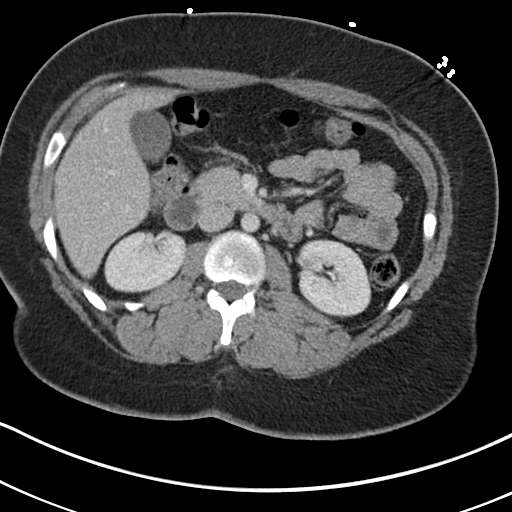
[im 70/87  soft-tissue]
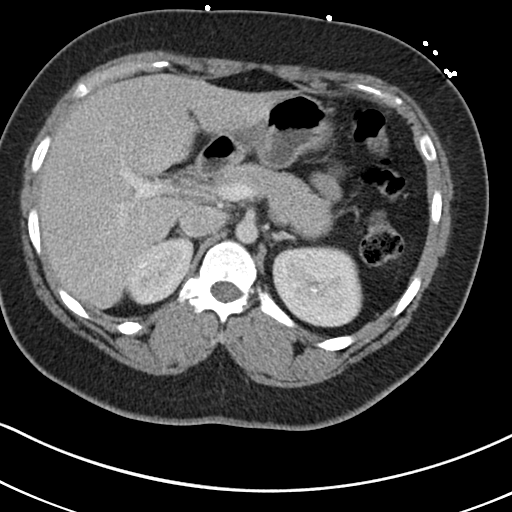
[im 74/87  soft-tissue]
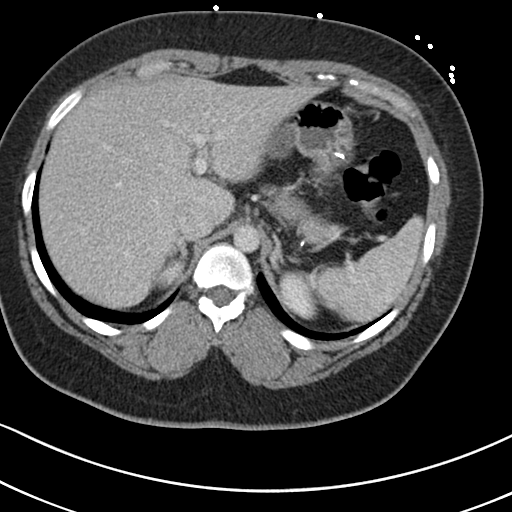
[im 82/87  soft-tissue]
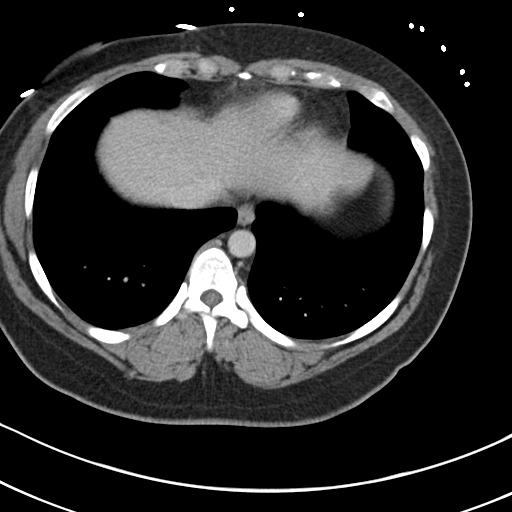

[Series 4: coronal st · coronal · 0.74mm/px · 3 of 142 slices shown]
[im 48/142  soft-tissue]
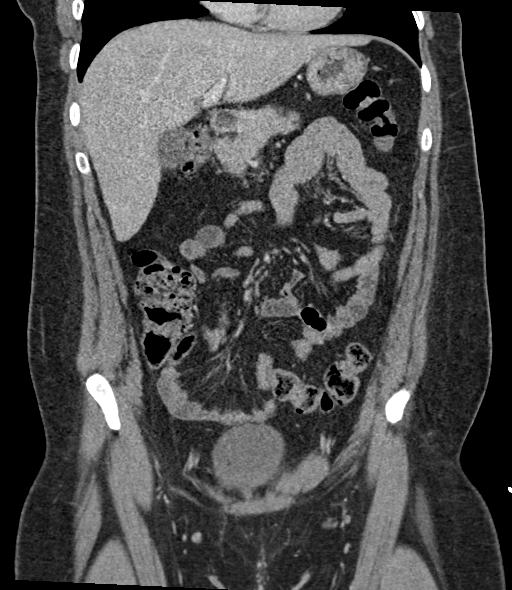
[im 63/142  soft-tissue]
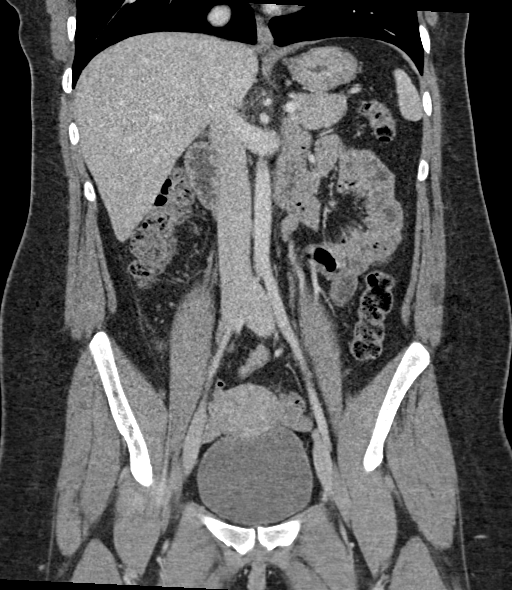
[im 79/142  soft-tissue]
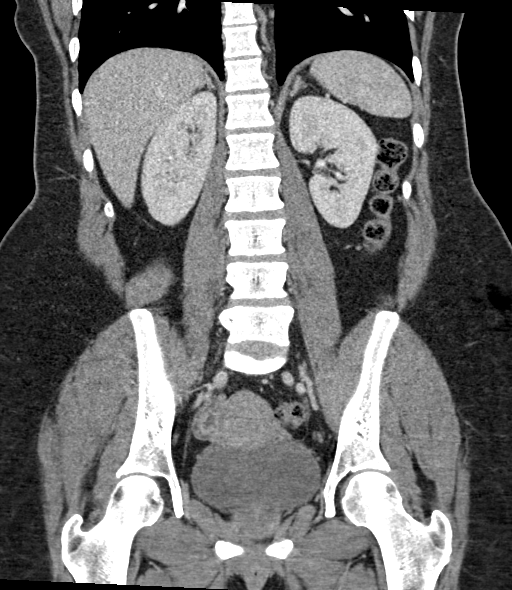

[16 of 46 positions shown; findings below may reference images not displayed]

FINDINGS: Lower chest: No acute abnormality.

Hepatobiliary: No focal liver abnormality is seen. No gallstones,
gallbladder wall thickening, or biliary dilatation.

Pancreas: Unremarkable. No pancreatic ductal dilatation or
surrounding inflammatory changes.

Spleen: Normal in size without focal abnormality.

Adrenals/Urinary Tract: Adrenal glands are unremarkable. A
nonobstructive right renal calculus measures 2 mm. Otherwise, the
kidneys are normal, without renal calculi, focal lesion, or
hydronephrosis. Bladder is unremarkable.

Stomach/Bowel: Stomach is within normal limits. Appendix appears
normal. No evidence of bowel wall thickening, distention, or
inflammatory changes.

Vascular/Lymphatic: No significant vascular findings are present. No
enlarged abdominal or pelvic lymph nodes.

Reproductive: Uterus and bilateral adnexa are unremarkable.

Other: No abdominal wall hernia or abnormality. No abdominopelvic
ascites.

Musculoskeletal: No acute or significant osseous findings.
IMPRESSION: No findings to explain the patient's symptoms.
